# Patient Record
Sex: Male | Born: 1995 | Race: Black or African American | Hispanic: No | Marital: Single | Smoking: Never smoker
Health system: Southern US, Community
[De-identification: ages and names within clinical notes are randomized; demographics above are authoritative.]

## PROBLEM LIST (undated history)

## (undated) DIAGNOSIS — K56609 Unspecified intestinal obstruction, unspecified as to partial versus complete obstruction: Secondary | ICD-10-CM

---

## 2019-10-11 ENCOUNTER — Encounter (HOSPITAL_BASED_OUTPATIENT_CLINIC_OR_DEPARTMENT_OTHER): Payer: Self-pay | Admitting: *Deleted

## 2019-10-11 ENCOUNTER — Emergency Department (HOSPITAL_BASED_OUTPATIENT_CLINIC_OR_DEPARTMENT_OTHER): Payer: No Typology Code available for payment source

## 2019-10-11 ENCOUNTER — Other Ambulatory Visit: Payer: Self-pay

## 2019-10-11 ENCOUNTER — Emergency Department (HOSPITAL_BASED_OUTPATIENT_CLINIC_OR_DEPARTMENT_OTHER)
Admission: EM | Admit: 2019-10-11 | Discharge: 2019-10-11 | Disposition: A | Discharge status: Home / Self Care | Payer: No Typology Code available for payment source | Attending: Emergency Medicine | Admitting: Emergency Medicine

## 2019-10-11 DIAGNOSIS — Y93I9 Activity, other involving external motion: Secondary | ICD-10-CM | POA: Insufficient documentation

## 2019-10-11 DIAGNOSIS — F32A Depression, unspecified: Secondary | ICD-10-CM

## 2019-10-11 DIAGNOSIS — Y999 Unspecified external cause status: Secondary | ICD-10-CM | POA: Diagnosis not present

## 2019-10-11 DIAGNOSIS — R45851 Suicidal ideations: Secondary | ICD-10-CM | POA: Insufficient documentation

## 2019-10-11 DIAGNOSIS — F329 Major depressive disorder, single episode, unspecified: Secondary | ICD-10-CM | POA: Insufficient documentation

## 2019-10-11 DIAGNOSIS — Y92410 Unspecified street and highway as the place of occurrence of the external cause: Secondary | ICD-10-CM | POA: Insufficient documentation

## 2019-10-11 DIAGNOSIS — Z79899 Other long term (current) drug therapy: Secondary | ICD-10-CM | POA: Diagnosis not present

## 2019-10-11 DIAGNOSIS — M542 Cervicalgia: Secondary | ICD-10-CM | POA: Insufficient documentation

## 2019-10-11 HISTORY — DX: Unspecified intestinal obstruction, unspecified as to partial versus complete obstruction: K56.609

## 2019-10-11 MED ORDER — IBUPROFEN 400 MG PO TABS
600.0000 mg | ORAL_TABLET | Freq: Once | ORAL | Status: AC
  Administered 2019-10-11: 600 mg via ORAL
  Filled 2019-10-11: qty 1
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
## 2019-10-11 NOTE — ED Notes (Signed)
Patient stated that he felt like hurting himself.  He has been suicidal for 2 years.  He is not planning to hurt anybody.  Charge nurse and Primary nurse made aware.
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
## 2019-10-11 NOTE — ED Notes (Signed)
ED Provider at bedside. 
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
